# Patient Record
Sex: Male | Born: 1940 | Race: Black or African American | Hispanic: No | Marital: Married
Health system: Southern US, Community
[De-identification: ages and names within clinical notes are randomized; demographics above are authoritative.]

## PROBLEM LIST (undated history)

## (undated) DIAGNOSIS — I10 Essential (primary) hypertension: Secondary | ICD-10-CM

---

## 2019-04-24 DIAGNOSIS — H524 Presbyopia: Secondary | ICD-10-CM | POA: Diagnosis not present

## 2019-05-29 DIAGNOSIS — N529 Male erectile dysfunction, unspecified: Secondary | ICD-10-CM | POA: Diagnosis not present

## 2019-05-29 DIAGNOSIS — I10 Essential (primary) hypertension: Secondary | ICD-10-CM | POA: Diagnosis not present

## 2019-05-29 DIAGNOSIS — E7849 Other hyperlipidemia: Secondary | ICD-10-CM | POA: Diagnosis not present

## 2019-05-29 DIAGNOSIS — R229 Localized swelling, mass and lump, unspecified: Secondary | ICD-10-CM | POA: Diagnosis not present

## 2019-08-01 ENCOUNTER — Emergency Department
Admission: EM | Admit: 2019-08-01 | Discharge: 2019-08-01 | Disposition: A | Discharge status: Home / Self Care | Payer: Medicare Other | Attending: Emergency Medicine | Admitting: Emergency Medicine

## 2019-08-01 ENCOUNTER — Emergency Department: Payer: Medicare Other

## 2019-08-01 ENCOUNTER — Encounter: Payer: Self-pay | Admitting: Emergency Medicine

## 2019-08-01 ENCOUNTER — Other Ambulatory Visit: Payer: Self-pay

## 2019-08-01 DIAGNOSIS — Y998 Other external cause status: Secondary | ICD-10-CM | POA: Insufficient documentation

## 2019-08-01 DIAGNOSIS — Z23 Encounter for immunization: Secondary | ICD-10-CM | POA: Insufficient documentation

## 2019-08-01 DIAGNOSIS — Y9389 Activity, other specified: Secondary | ICD-10-CM | POA: Diagnosis not present

## 2019-08-01 DIAGNOSIS — S60942A Unspecified superficial injury of right middle finger, initial encounter: Secondary | ICD-10-CM | POA: Diagnosis present

## 2019-08-01 DIAGNOSIS — S61312A Laceration without foreign body of right middle finger with damage to nail, initial encounter: Secondary | ICD-10-CM | POA: Insufficient documentation

## 2019-08-01 DIAGNOSIS — I10 Essential (primary) hypertension: Secondary | ICD-10-CM | POA: Diagnosis not present

## 2019-08-01 DIAGNOSIS — Y92812 Truck as the place of occurrence of the external cause: Secondary | ICD-10-CM | POA: Insufficient documentation

## 2019-08-01 DIAGNOSIS — Z79899 Other long term (current) drug therapy: Secondary | ICD-10-CM | POA: Insufficient documentation

## 2019-08-01 DIAGNOSIS — W231XXA Caught, crushed, jammed, or pinched between stationary objects, initial encounter: Secondary | ICD-10-CM | POA: Insufficient documentation

## 2019-08-01 DIAGNOSIS — S62662A Nondisplaced fracture of distal phalanx of right middle finger, initial encounter for closed fracture: Secondary | ICD-10-CM | POA: Diagnosis not present

## 2019-08-01 HISTORY — DX: Essential (primary) hypertension: I10

## 2019-08-01 MED ORDER — HYDROCODONE-ACETAMINOPHEN 5-325 MG PO TABS: 1.0000 | ORAL_TABLET | Freq: Four times a day (QID) | ORAL | 0 refills | Status: AC | PRN

## 2019-08-01 MED ORDER — LIDOCAINE HCL (PF) 1 % IJ SOLN
5.0000 mL | Freq: Once | INTRAMUSCULAR | Status: AC
  Administered 2019-08-01: 5 mL via INTRADERMAL
  Filled 2019-08-01: qty 5

## 2019-08-01 MED ORDER — TRAMADOL HCL 50 MG PO TABS
50.0000 mg | ORAL_TABLET | Freq: Once | ORAL | Status: AC
  Administered 2019-08-01: 50 mg via ORAL
  Filled 2019-08-01: qty 1

## 2019-08-01 MED ORDER — BUPIVACAINE HCL (PF) 0.5 % IJ SOLN
10.0000 mL | Freq: Once | INTRAMUSCULAR | Status: AC
  Administered 2019-08-01: 10 mL
  Filled 2019-08-01: qty 10

## 2019-08-01 MED ORDER — CEPHALEXIN 500 MG PO CAPS
500.0000 mg | ORAL_CAPSULE | Freq: Two times a day (BID) | ORAL | 0 refills | Status: AC
Start: 2019-08-01 — End: 2019-08-08

## 2019-08-01 MED ORDER — TETANUS-DIPHTH-ACELL PERTUSSIS 5-2.5-18.5 LF-MCG/0.5 IM SUSP
0.5000 mL | Freq: Once | INTRAMUSCULAR | Status: AC
  Administered 2019-08-01: 0.5 mL via INTRAMUSCULAR
  Filled 2019-08-01: qty 0.5

## 2019-08-01 NOTE — Discharge Instructions (Signed)
Do not get the sutured area wet for 24 hours. After 24 hours, shower/bathe as usual and pat the area dry. Change the bandage 2 times per day and apply antibiotic ointment. Leave open to air when at no risk of getting the area dirty, but cover at night before bed.

## 2019-08-01 NOTE — ED Triage Notes (Signed)
Pt reports was loading a golf cart onto a truck and smashed his right hand middle finger. Bleeding controlled at this time

## 2019-08-01 NOTE — ED Notes (Addendum)
See triage note  Presents with injury to right middle finger  States he was loading  something onto a truck and shut the tailgate  Hit his finger  smashed his finger

## 2019-08-01 NOTE — ED Provider Notes (Signed)
[facility] Emergency Department Provider Note  ____________________________________________  Time seen: Approximately 10:50 AM  I have reviewed the triage vital signs and the nursing notes.   HISTORY  Chief Complaint Finger Injury and Laceration   HPI Manuel Goodwin is a 79 y.o. male presents to the emergency department for treatment and evaluation after smashing his right middle finger between a dog house and the tailgate.  He states this happened while he was loading the dog house in the back of the truck.  He has a laceration through the nail of the right middle finger.  Tdap is not current.  Bleeding is well controlled.  No alleviating measures attempted prior to arrival.   Past Medical History:  Diagnosis Date   Hypertension     There are no problems to display for this patient.   History reviewed. No pertinent surgical history.  Prior to Admission medications   Medication Sig Start Date End Date Taking? Authorizing Provider  atorvastatin (LIPITOR) 80 MG tablet Take 80 mg by mouth daily.   Yes [provider]  clopidogrel (PLAVIX) 75 MG tablet Take 75 mg by mouth daily.   Yes [provider]  hydrochlorothiazide (HYDRODIURIL) 25 MG tablet Take 25 mg by mouth daily.   Yes [provider]  lisinopril (ZESTRIL) 20 MG tablet Take 20 mg by mouth daily.   Yes [provider]  cephALEXin (KEFLEX) 500 MG capsule Take 1 capsule (500 mg total) by mouth 2 (two) times daily for 7 days. 08/01/19 08/08/19  Aiyah Scarpelli, Johnette Abraham B, FNP  HYDROcodone-acetaminophen (NORCO/VICODIN) 5-325 MG tablet Take 1 tablet by mouth every 6 (six) hours as needed for up to 3 days for severe pain. 08/01/19 08/04/19  Sherrie George B, FNP    Allergies Codeine  No family history on file.  Social History Social History   Tobacco Use   Smoking status: Not on file  Substance Use Topics   Alcohol use: Not on file   Drug use: Not on file    Review  of Systems  Constitutional: Negative for fever. Respiratory: Negative for cough or shortness of breath.  Musculoskeletal: Negative for myalgias Skin: Positive for laceration to the right middle finger. Neurological: Negative for numbness or paresthesias. ____________________________________________   PHYSICAL EXAM:  VITAL SIGNS: ED Triage Vitals  Enc Vitals Group     BP 08/01/19 1008 (!) 168/87     Pulse Rate 08/01/19 1008 67     Resp 08/01/19 1008 19     Temp 08/01/19 1008 98.4 F (36.9 C)     Temp Source 08/01/19 1008 Oral     SpO2 08/01/19 1008 100 %     Weight 08/01/19 1001 160 lb (72.6 kg)     Height 08/01/19 1001 5\' 6"  (1.676 m)     Head Circumference --      Peak Flow --      Pain Score 08/01/19 1001 10     Pain Loc --      Pain Edu? --      Excl. in [place]? --      Constitutional: Well appearing. Eyes: Conjunctivae are clear without discharge or drainage. Nose: No rhinorrhea noted. Mouth/Throat: Airway is patent.  Neck: No stridor. Unrestricted range of motion observed. Cardiovascular: Capillary refill is <3 seconds.  Respiratory: Respirations are even and unlabored.. Musculoskeletal: Unrestricted range of motion observed. Neurologic: Awake, alert, and oriented x 4.  Skin: Horizontal laceration through the upper third of the nail of the right middle finger.  ____________________________________________   LABS (all labs ordered are listed, but only abnormal results are displayed)  Labs Reviewed - No data to display ____________________________________________  EKG  Not indicated. ____________________________________________  RADIOLOGY  Nondisplaced tuft fracture of the right middle finger. ____________________________________________   PROCEDURES  .Marland KitchenLaceration Repair  Date/Time: 08/01/2019 3:22 PM Performed by: Victorino Dike, FNP Authorized by: Victorino Dike, FNP   Consent:    Consent obtained:  Verbal   Consent given by:  Patient    Risks discussed:  Infection, pain and poor cosmetic result Anesthesia (see MAR for exact dosages):    Anesthesia method:  Local infiltration   Local anesthetic:  Lidocaine 1% w/o epi and bupivacaine 0.5% w/o epi Laceration details:    Location:  Finger   Finger location:  R long finger Repair type:    Repair type:  Intermediate Pre-procedure details:    Preparation:  Patient was prepped and draped in usual sterile fashion Treatment:    Area cleansed with:  Betadine and saline   Amount of cleaning:  Standard   Irrigation method:  Tap   Visualized foreign bodies/material removed: no   Skin repair:    Repair method:  Sutures   Suture size:  4-0   Suture material:  Fast-absorbing gut   Suture technique:  Simple interrupted   Number of sutures:  3 Approximation:    Approximation:  Close Post-procedure details:    Dressing:  Splint for protection   Patient tolerance of procedure:  Tolerated well, no immediate complications   ____________________________________________   INITIAL IMPRESSION / ASSESSMENT AND PLAN / ED COURSE  Manuel Goodwin is a 79 y.o. male presenting to the emergency department for treatment and evaluation after accidentally smashing his finger.  See HPI for further details.  Plan will be to update his tetanus booster and get an x-ray of the finger.  X-ray shows a nondisplaced tuft fracture.  Plan will be to perform digital block and further assess the laceration through the nailbed.  Laceration through closed.  See procedure note for details.  Patient tolerated the procedure well.  He will follow up with orthopedics.  He will be placed on Keflex and given Norco for pain.  He was encouraged to see primary care or return to the emergency department for sign or concern of infection if he is unable to see orthopedics.     Medications  traMADol (ULTRAM) tablet 50 mg (50 mg Oral Given 08/01/19 1108)  Tdap (BOOSTRIX) injection 0.5 mL (0.5 mLs Intramuscular Given 08/01/19  1109)  lidocaine (PF) (XYLOCAINE) 1 % injection 5 mL (5 mLs Intradermal Given 08/01/19 1109)  bupivacaine (MARCAINE) 0.5 % injection 10 mL (10 mLs Infiltration Given 08/01/19 1109)     Pertinent labs & imaging results that were available during my care of the patient were reviewed by me and considered in my medical decision making (see chart for details).  ____________________________________________   FINAL CLINICAL IMPRESSION(S) / ED DIAGNOSES  Final diagnoses:  Laceration of right middle finger without foreign body with damage to nail, initial encounter    ED Discharge Orders         Ordered    cephALEXin (KEFLEX) 500 MG capsule  2 times daily     Discontinue  Reprint     08/01/19 1248    HYDROcodone-acetaminophen (NORCO/VICODIN) 5-325 MG tablet  Every 6 hours PRN     Discontinue  Reprint     08/01/19 1248  Note:  This document was prepared using Dragon voice recognition software and may include unintentional dictation errors.   Victorino Dike, FNP 08/01/19 1527    Earleen Newport, MD 08/02/19 [phone]

## 2019-10-08 DIAGNOSIS — M545 Low back pain: Secondary | ICD-10-CM | POA: Diagnosis not present

## 2019-10-08 DIAGNOSIS — R229 Localized swelling, mass and lump, unspecified: Secondary | ICD-10-CM | POA: Diagnosis not present

## 2019-10-08 DIAGNOSIS — C61 Malignant neoplasm of prostate: Secondary | ICD-10-CM | POA: Diagnosis not present

## 2019-10-24 ENCOUNTER — Other Ambulatory Visit: Payer: Self-pay | Admitting: Otolaryngology

## 2019-10-24 DIAGNOSIS — R221 Localized swelling, mass and lump, neck: Secondary | ICD-10-CM

## 2019-10-24 DIAGNOSIS — D37030 Neoplasm of uncertain behavior of the parotid salivary glands: Secondary | ICD-10-CM | POA: Diagnosis not present

## 2019-11-17 ENCOUNTER — Ambulatory Visit
Admission: RE | Admit: 2019-11-17 | Discharge: 2019-11-17 | Disposition: A | Discharge status: Home / Self Care | Payer: Medicare Other | Source: Ambulatory Visit | Attending: Otolaryngology | Admitting: Otolaryngology

## 2019-11-17 ENCOUNTER — Other Ambulatory Visit: Payer: Self-pay

## 2019-11-17 DIAGNOSIS — R221 Localized swelling, mass and lump, neck: Secondary | ICD-10-CM

## 2019-11-17 MED ORDER — IOPAMIDOL (ISOVUE-370) INJECTION 76%
75.0000 mL | Freq: Once | INTRAVENOUS | Status: AC | PRN
  Administered 2019-11-17: 75 mL via INTRAVENOUS

## 2020-02-09 ENCOUNTER — Other Ambulatory Visit (HOSPITAL_COMMUNITY): Payer: Self-pay | Admitting: Otolaryngology

## 2020-02-09 DIAGNOSIS — K118 Other diseases of salivary glands: Secondary | ICD-10-CM

## 2020-02-10 NOTE — Progress Notes (Signed)
Manuel Goodwin Male, 80 y.o., 26-Jan-1940  MRN:  [id number] Phone:  [phone] (M)       PCP:  Patient, No Pcp Per Coverage:  [employer] Medicare/[employer] Medicare            RE: Biopsy Received: Today  Message Details  Greggory Keen, MD  Manuel Goodwin for [place] bx left parotid mass   See recent neck CT   TS    Previous Messages  ----- Message -----  From: Lenore Cordia  Sent: 02/09/2020  2:47 PM EST  To: Ir Procedure Requests  Subject: Biopsy                      Procedure Requested: FNA    Reason for Procedure: parotid mass    Provider Requesting: Dr Benjamine Mola  Provider Telephone: [phone]    Other Info: Parotid Mass seen on neck CT

## 2020-03-24 ENCOUNTER — Other Ambulatory Visit (HOSPITAL_COMMUNITY): Payer: Self-pay | Admitting: Internal Medicine

## 2020-03-24 ENCOUNTER — Ambulatory Visit (HOSPITAL_COMMUNITY)
Admission: RE | Admit: 2020-03-24 | Discharge: 2020-03-24 | Disposition: A | Discharge status: Home / Self Care | Payer: Medicare (Managed Care) | Source: Ambulatory Visit | Attending: Internal Medicine | Admitting: Internal Medicine

## 2020-03-24 ENCOUNTER — Other Ambulatory Visit: Payer: Self-pay | Admitting: Internal Medicine

## 2020-03-24 ENCOUNTER — Other Ambulatory Visit: Payer: Self-pay | Admitting: Otolaryngology

## 2020-03-24 ENCOUNTER — Other Ambulatory Visit: Payer: Self-pay

## 2020-03-24 DIAGNOSIS — Z8673 Personal history of transient ischemic attack (TIA), and cerebral infarction without residual deficits: Secondary | ICD-10-CM | POA: Insufficient documentation

## 2020-03-24 DIAGNOSIS — K118 Other diseases of salivary glands: Secondary | ICD-10-CM

## 2020-03-24 DIAGNOSIS — Z8639 Personal history of other endocrine, nutritional and metabolic disease: Secondary | ICD-10-CM

## 2020-03-24 DIAGNOSIS — Z8669 Personal history of other diseases of the nervous system and sense organs: Secondary | ICD-10-CM

## 2020-03-29 ENCOUNTER — Other Ambulatory Visit: Payer: Self-pay | Admitting: Student

## 2020-03-30 ENCOUNTER — Other Ambulatory Visit: Payer: Self-pay

## 2020-03-30 ENCOUNTER — Ambulatory Visit
Admission: RE | Admit: 2020-03-30 | Discharge: 2020-03-30 | Disposition: A | Discharge status: Home / Self Care | Payer: Medicare (Managed Care) | Source: Ambulatory Visit | Attending: Otolaryngology | Admitting: Otolaryngology

## 2020-03-30 DIAGNOSIS — K118 Other diseases of salivary glands: Secondary | ICD-10-CM | POA: Diagnosis not present

## 2020-03-30 NOTE — Procedures (Signed)
  Procedure: US guided core biopsy L parotid nodule   EBL:   minimal Complications:  none immediate  See full dictation in [employer].  Dillard Cannon MD Main # [phone] Pager  [phone] Mobile [phone]

## 2020-03-30 NOTE — H&P (Signed)
Chief Complaint: Patient was seen in consultation today for parotid mass biopsy  Referring Physician(s): Benjamine Mola, Su  Supervising Physician: Arne Cleveland  Patient Status: [facility] - Out-pt  History of Present Illness: Manuel Goodwin is a 80 y.o. male with a medical history significant for HTN. He has a mass under his left ear that seems to be enlarging; imaging obtained.   CT soft tissue neck 11/17/19 Salivary glands: Submandibular glands are normal. Right parotid gland is normal. Within the deep lobe of the left parotid, extending to the deep/superficial junction, there is a somewhat subtle mass measuring 2.2 x 1.2 x 1.2 cm. This does not appear distinctly marginated. Therefore, this is viewed with some concern. This could represent a benign or malignant lesion.  Interventional Radiology has been asked to evaluate this patient for an image-guided left parotid mass biopsy for further work up. This case has been reviewed and procedure approved by Dr. Annamaria Boots.   Past Medical History:  Diagnosis Date  . Hypertension     No past surgical history on file.  Allergies: Codeine  Medications: Prior to Admission medications   Medication Sig Start Date End Date Taking? Authorizing Provider  atorvastatin (LIPITOR) 80 MG tablet Take 80 mg by mouth daily.    [provider]  clopidogrel (PLAVIX) 75 MG tablet Take 75 mg by mouth daily.    [provider]  hydrochlorothiazide (HYDRODIURIL) 25 MG tablet Take 25 mg by mouth daily.    [provider]  lisinopril (ZESTRIL) 20 MG tablet Take 20 mg by mouth daily.    [provider]     No family history on file.  Social History   Socioeconomic History  . Marital status: Married    Spouse name: Not on file  . Number of children: Not on file  . Years of education: Not on file  . Highest education level: Not on file  Occupational History  . Not on file  Tobacco Use  . Smoking status: Not on file  .  Smokeless tobacco: Not on file  Substance and Sexual Activity  . Alcohol use: Not on file  . Drug use: Not on file  . Sexual activity: Not on file  Other Topics Concern  . Not on file  Social History Narrative  . Not on file   Social Determinants of Health   Financial Resource Strain: Not on file  Food Insecurity: Not on file  Transportation Needs: Not on file  Physical Activity: Not on file  Stress: Not on file  Social Connections: Not on file    Review of Systems: A 12 point ROS discussed and pertinent positives are indicated in the HPI above.  All other systems are negative.  Imaging: VAS US CAROTID  Result Date: 03/24/2020 Carotid Arterial Duplex Study Indications:  Carotid artery disease. Patient reports no symptoms. 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 Performing Technologist: Ronal Fear RVS, RCS  Examination Guidelines: A complete evaluation includes B-mode imaging, spectral Doppler, color Doppler, and power Doppler as needed of all accessible portions of each vessel. Bilateral testing is considered an integral part of a complete examination. Limited examinations for reoccurring indications may be performed as noted.  Right Carotid Findings: +----------+--------+--------+--------+------------------+--------+           PSV cm/sEDV cm/sStenosisPlaque DescriptionComments +----------+--------+--------+--------+------------------+--------+ CCA Prox  106     19                                         +----------+--------+--------+--------+------------------+--------+  CCA Mid   96      17              heterogenous               +----------+--------+--------+--------+------------------+--------+ CCA Distal70      17              heterogenous               +----------+--------+--------+--------+------------------+--------+ ICA Prox  45      16      Normal                             +----------+--------+--------+--------+------------------+--------+  ICA Mid   61      19                                         +----------+--------+--------+--------+------------------+--------+ ICA Distal76      24                                         +----------+--------+--------+--------+------------------+--------+ ECA       58      10                                         +----------+--------+--------+--------+------------------+--------+ +----------+--------+-------+----------------+-------------------+           PSV cm/sEDV cmsDescribe        Arm Pressure (mmHG) +----------+--------+-------+----------------+-------------------+ [id number]            Multiphasic, WNL                    +----------+--------+-------+----------------+-------------------+ +---------+--------+--+--------+--+---------+ VertebralPSV cm/s61EDV cm/s15Antegrade +---------+--------+--+--------+--+---------+  Left Carotid Findings: +----------+--------+--------+--------+------------------+--------+           PSV cm/sEDV cm/sStenosisPlaque DescriptionComments +----------+--------+--------+--------+------------------+--------+ CCA Prox  143     32                                         +----------+--------+--------+--------+------------------+--------+ CCA Mid   99      27              heterogenous               +----------+--------+--------+--------+------------------+--------+ CCA Distal80      18              heterogenous               +----------+--------+--------+--------+------------------+--------+ ICA Prox  93      30      1-39%   heterogenous               +----------+--------+--------+--------+------------------+--------+ ICA Mid   106     28                                         +----------+--------+--------+--------+------------------+--------+ ICA Distal82      29                                         +----------+--------+--------+--------+------------------+--------+  ECA       80      17                                          +----------+--------+--------+--------+------------------+--------+ +----------+--------+--------+----------------+-------------------+           PSV cm/sEDV cm/sDescribe        Arm Pressure (mmHG) +----------+--------+--------+----------------+-------------------+ [id number]             Multiphasic, WNL                    +----------+--------+--------+----------------+-------------------+ +---------+--------+--+--------+--+---------+ VertebralPSV cm/s51EDV cm/s14Antegrade +---------+--------+--+--------+--+---------+   Summary: Right Carotid: There is no evidence of stenosis in the right ICA. Left Carotid: Velocities in the left ICA are consistent with a 1-39% stenosis. Vertebrals:  Bilateral vertebral arteries demonstrate antegrade flow. Subclavians: Normal flow hemodynamics were seen in bilateral subclavian              arteries. *See table(s) above for measurements and observations.  Electronically signed by Deitra Mayo MD on 03/24/2020 at 10:24:11 AM.    Final     Labs:  CBC: No results for input(s): WBC, HGB, HCT, PLT in the last 8760 hours.  COAGS: No results for input(s): INR, APTT in the last 8760 hours.  BMP: No results for input(s): NA, K, CL, CO2, GLUCOSE, BUN, CALCIUM, CREATININE, GFRNONAA, GFRAA in the last 8760 hours.  Invalid input(s): CMP  LIVER FUNCTION TESTS: No results for input(s): BILITOT, AST, ALT, ALKPHOS, PROT, ALBUMIN in the last 8760 hours.  TUMOR MARKERS: No results for input(s): AFPTM, CEA, CA199, CHROMGRNA in the last 8760 hours.  Assessment and Plan:  Left Parotid Mass: Manuel Goodwin, 80 year old male, presents today to the [facility] Interventional Radiology department for an image-guided left parotid mass biopsy. This patient has requested local anesthesia only. He states he has had this procedure done before.   Risks and benefits of this procedure were discussed  with the patient and/or patient's family including, but not limited to bleeding, infection, damage to adjacent structures or low yield requiring additional tests.  All of the questions were answered and there is agreement to proceed.  Consent signed and in chart.  Thank you for this interesting consult.  I greatly enjoyed meeting Manuel Goodwin and look forward to participating in their care.  A copy of this report was sent to the requesting provider on this date.  Electronically Signed: Soyla Dryer, AGACNP-BC [phone] 03/30/2020, 9:44 AM   I spent a total of  30 Minutes   in face to face in clinical consultation, greater than 50% of which was counseling/coordinating care for left parotid mass biopsy

## 2020-03-31 LAB — SURGICAL PATHOLOGY

## 2021-04-20 ENCOUNTER — Ambulatory Visit: Payer: Medicare (Managed Care) | Admitting: Surgery

## 2021-11-10 ENCOUNTER — Other Ambulatory Visit: Payer: Self-pay | Admitting: Otolaryngology

## 2021-11-10 ENCOUNTER — Other Ambulatory Visit (HOSPITAL_COMMUNITY): Payer: Self-pay | Admitting: Otolaryngology

## 2021-11-10 DIAGNOSIS — T17908A Unspecified foreign body in respiratory tract, part unspecified causing other injury, initial encounter: Secondary | ICD-10-CM

## 2021-11-10 DIAGNOSIS — D110 Benign neoplasm of parotid gland: Secondary | ICD-10-CM

## 2021-11-10 DIAGNOSIS — R1312 Dysphagia, oropharyngeal phase: Secondary | ICD-10-CM

## 2021-11-11 ENCOUNTER — Encounter: Payer: Self-pay | Admitting: General Practice

## 2021-11-11 NOTE — Progress Notes (Signed)
Arne Cleveland, MD  Allen Kell, NT Ok  [place] core L parotid lesion (also previously done in 2022)  [facility]       Previous Messages    ----- Message ----- From: Allen Kell, NT Sent: 11/11/2021   9:13 AM EDT To: Ir Procedure Requests Subject: [place] Core Biopsy                                Procedure: [place] CORE BIOPSY (SALIVARY GLAND/PAROTID GLAND)  Reason: Pleomorphic adenoma of parotid gland  History: CT done in 2021  Provider: Jenetta Downer, MD  Contact: [phone]

## 2021-11-15 ENCOUNTER — Other Ambulatory Visit: Payer: Self-pay | Admitting: Otolaryngology

## 2021-11-15 DIAGNOSIS — D110 Benign neoplasm of parotid gland: Secondary | ICD-10-CM

## 2021-11-22 ENCOUNTER — Other Ambulatory Visit: Payer: Self-pay | Admitting: Radiology

## 2021-11-22 DIAGNOSIS — K118 Other diseases of salivary glands: Secondary | ICD-10-CM

## 2021-11-23 ENCOUNTER — Ambulatory Visit (HOSPITAL_COMMUNITY): Payer: Medicare (Managed Care)

## 2021-11-23 ENCOUNTER — Encounter (HOSPITAL_COMMUNITY): Payer: Self-pay

## 2021-12-19 ENCOUNTER — Other Ambulatory Visit: Payer: Self-pay | Admitting: Radiology

## 2021-12-20 ENCOUNTER — Ambulatory Visit (HOSPITAL_COMMUNITY)
Admission: RE | Admit: 2021-12-20 | Discharge: 2021-12-20 | Disposition: A | Discharge status: Home / Self Care | Payer: Medicare (Managed Care) | Source: Ambulatory Visit | Attending: Otolaryngology | Admitting: Otolaryngology

## 2021-12-20 ENCOUNTER — Other Ambulatory Visit: Payer: Self-pay

## 2021-12-20 ENCOUNTER — Encounter (HOSPITAL_COMMUNITY): Payer: Self-pay

## 2021-12-20 DIAGNOSIS — Z7902 Long term (current) use of antithrombotics/antiplatelets: Secondary | ICD-10-CM | POA: Diagnosis not present

## 2021-12-20 DIAGNOSIS — D110 Benign neoplasm of parotid gland: Secondary | ICD-10-CM | POA: Diagnosis not present

## 2021-12-20 DIAGNOSIS — Z8673 Personal history of transient ischemic attack (TIA), and cerebral infarction without residual deficits: Secondary | ICD-10-CM | POA: Insufficient documentation

## 2021-12-20 DIAGNOSIS — K118 Other diseases of salivary glands: Secondary | ICD-10-CM

## 2021-12-20 LAB — CBC WITH DIFFERENTIAL/PLATELET
Abs Immature Granulocytes: 0 10*3/uL (ref 0.00–0.07)
Basophils Absolute: 0 10*3/uL (ref 0.0–0.1)
Basophils Relative: 0 %
Eosinophils Absolute: 0.2 10*3/uL (ref 0.0–0.5)
Eosinophils Relative: 6 %
HCT: 37.4 % — ABNORMAL LOW (ref 39.0–52.0)
Hemoglobin: 11.7 g/dL — ABNORMAL LOW (ref 13.0–17.0)
Immature Granulocytes: 0 %
Lymphocytes Relative: 42 %
Lymphs Abs: 1.5 10*3/uL (ref 0.7–4.0)
MCH: 27 pg (ref 26.0–34.0)
MCHC: 31.3 g/dL (ref 30.0–36.0)
MCV: 86.2 fL (ref 80.0–100.0)
Monocytes Absolute: 0.4 10*3/uL (ref 0.1–1.0)
Monocytes Relative: 10 %
Neutro Abs: 1.5 10*3/uL — ABNORMAL LOW (ref 1.7–7.7)
Neutrophils Relative %: 42 %
Platelets: 168 10*3/uL (ref 150–400)
RBC: 4.34 MIL/uL (ref 4.22–5.81)
RDW: 15.8 % — ABNORMAL HIGH (ref 11.5–15.5)
WBC: 3.6 10*3/uL — ABNORMAL LOW (ref 4.0–10.5)
nRBC: 0 % (ref 0.0–0.2)

## 2021-12-20 LAB — PROTIME-INR
INR: 1 (ref 0.8–1.2)
Prothrombin Time: 13.3 seconds (ref 11.4–15.2)

## 2021-12-20 LAB — BASIC METABOLIC PANEL
Anion gap: 5 (ref 5–15)
BUN: 12 mg/dL (ref 8–23)
CO2: 27 mmol/L (ref 22–32)
Calcium: 9 mg/dL (ref 8.9–10.3)
Chloride: 106 mmol/L (ref 98–111)
Creatinine, Ser: 1.07 mg/dL (ref 0.61–1.24)
GFR, Estimated: >60 mL/min (ref >60)
Glucose, Bld: 117 mg/dL — ABNORMAL HIGH (ref 70–99)
Potassium: 3.7 mmol/L (ref 3.5–5.1)
Sodium: 138 mmol/L (ref 135–145)

## 2021-12-20 MED ORDER — FENTANYL CITRATE (PF) 100 MCG/2ML IJ SOLN
INTRAMUSCULAR | Status: DC | PRN
  Administered 2021-12-20 (×2): 50 ug via INTRAVENOUS

## 2021-12-20 MED ORDER — FENTANYL CITRATE (PF) 100 MCG/2ML IJ SOLN
INTRAMUSCULAR | Status: AC
  Filled 2021-12-20: qty 2

## 2021-12-20 MED ORDER — SODIUM CHLORIDE 0.9 % IV SOLN: INTRAVENOUS | Status: DC

## 2021-12-20 MED ORDER — LIDOCAINE HCL 1 % IJ SOLN
INTRAMUSCULAR | Status: AC
  Administered 2021-12-20: 10 mL
  Filled 2021-12-20: qty 20

## 2021-12-20 NOTE — Sedation Documentation (Signed)
Pt ate breakfast. Pain meds only.

## 2021-12-20 NOTE — H&P (Signed)
    Referring Physician(s): [place]  Supervising Physician: Mir, Sharen Heck  Patient Status:  [facility]  Chief Complaint:  Left parotid mass  Subjective: Patient familiar to IR service from left parotid mass biopsy on 03/30/2020.  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  Patient now has more discomfort and increase in size of left parotid mass.  He now presents for repeat left parotid mass biopsy to rule out possible malignant transformation prior to possible left parotidectomy.Additional med hx as below. He denies fever,HA,CP,dyspnea, cough, abd /back pain,N/V or bleeding. 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   Past Medical History:  Diagnosis Date   Hypertension    No past surgical history on file.   Allergies: Codeine  Medications: Prior to Admission medications   Medication Sig Start Date End Date Taking? Authorizing Provider  atorvastatin (LIPITOR) 80 MG tablet Take 80 mg by mouth daily.    [provider]  clopidogrel (PLAVIX) 75 MG tablet Take 75 mg by mouth daily.    [provider]  hydrochlorothiazide (HYDRODIURIL) 25 MG tablet Take 25 mg by mouth daily.    [provider]  lisinopril (ZESTRIL) 20 MG tablet Take 20 mg by mouth daily.    [provider]     Vital Signs: Vitals:   12/20/21 1224  BP: 111/64  Pulse: 61  Resp: 18  Temp: 98.3 F (36.8 C)  SpO2: 95%       Physical Exam awake, alert.  Chest clear to auscultation bilaterally.  Heart with regular rate and rhythm.  Abdomen soft, positive bowel sounds, nontender, pretibial edema noted bilaterally.  Palpable nodule left parotid region, slightly tender to palpation  Imaging: No results found.  Labs:  CBC: No results for input(s): "WBC", "HGB", "HCT", "PLT" in the last 8760 hours.  COAGS: No results for input(s): "INR", "APTT" in the last 8760 hours.  BMP: No results for input(s): "NA", "K", "CL",  "CO2", "GLUCOSE", "BUN", "CALCIUM", "CREATININE", "GFRNONAA", "GFRAA" in the last 8760 hours.  Invalid input(s): "CMP"  LIVER FUNCTION TESTS: No results for input(s): "BILITOT", "AST", "ALT", "ALKPHOS", "PROT", "ALBUMIN" in the last 8760 hours.  Assessment and Plan: Patient familiar to IR service from left parotid mass biopsy on 03/30/2020.  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  Patient now has more discomfort and increase in size of left parotid mass.  He now presents for repeat left parotid mass biopsy to rule out possible malignant transformation prior to possible left parotidectomy.Risks and benefits of procedure was discussed with the patient including, but not limited to bleeding, infection, damage to adjacent structures or low yield requiring additional tests.  All of the questions were answered and there is agreement to proceed.  Consent signed and in chart.  LABS PENDING  Electronically Signed: D. Rowe Robert, PA-C 12/20/2021, 12:19 PM   I spent a total of 20 minutes at the the patient's bedside AND on the patient's hospital floor or unit, greater than 50% of which was counseling/coordinating care for image guided left parotid mass biopsy

## 2021-12-20 NOTE — Procedures (Signed)
Interventional Radiology Procedure Note  Procedure: US guided left parotid mass biopsy  Indication: Left intra parotid mass   Findings: Please refer to procedural dictation for full description.  Complications: None  EBL: < 10 mL  Miachel Roux, MD [phone]

## 2021-12-20 NOTE — Discharge Instructions (Signed)
Please call Interventional Radiology clinic [phone] with any questions or concerns.  You may remove your dressing and shower tomorrow.  Needle Biopsy, Care After These instructions tell you how to care for yourself after your procedure. Your doctor may also give you more specific instructions. Call your doctor if you have any problems or questions. What can I expect after the procedure? After the procedure, it is common to have: Soreness. Bruising. Mild pain. Follow these instructions at home:  Return to your normal activities as told by your doctor. Ask your doctor what activities are safe for you. Take over-the-counter and prescription medicines only as told by your doctor. Wash your hands with soap and water before you change your bandage (dressing). If you cannot use soap and water, use hand sanitizer. Follow instructions from your doctor about: How to take care of your puncture site. When and how to change your bandage. When to remove your bandage. Check your puncture site every day for signs of infection. Watch for: Redness, swelling, or pain. Fluid or blood.  Pus or a bad smell. Warmth. Do not take baths, swim, or use a hot tub until your doctor approves. Ask your doctor if you may take showers. You may only be allowed to take sponge baths. Keep all follow-up visits as told by your doctor. This is important. Contact a doctor if you have: A fever. Redness, swelling, or pain at the puncture site, and it lasts longer than a few days. Fluid, blood, or pus coming from the puncture site. Warmth coming from the puncture site. Get help right away if: You have a lot of bleeding from the puncture site. Summary After the procedure, it is common to have soreness, bruising, or mild pain at the puncture site. Check your puncture site every day for signs of infection, such as redness, swelling, or pain. Get help right away if you have severe bleeding from your puncture site. This  information is not intended to replace advice given to you by your health care provider. Make sure you discuss any questions you have with your health care provider. Document Revised: 01/08/2017 Document Reviewed: 01/08/2017 Elsevier Patient Education  2020 [employer].

## 2021-12-21 LAB — SURGICAL PATHOLOGY

## 2021-12-23 ENCOUNTER — Ambulatory Visit (HOSPITAL_COMMUNITY): Payer: Medicare (Managed Care)

## 2022-03-26 IMAGING — CT CT NECK W/ CM
2 of 3 series · 7 of 14 positions shown, 8 images · IV contrast (iopamidol)
Comparison: None.

CLINICAL DATA: Mass under left ear.  Possibly enlarging.

Creatinine was obtained on site at [HOSPITAL] at [HOSPITAL].
Results: Creatinine 1.2 mg/dL.
EXAM:
CT NECK WITH CONTRAST
TECHNIQUE: Multidetector CT imaging of the neck was performed using the
standard protocol following the bolus administration of intravenous
contrast.
CONTRAST:  75mL S5ITQM-XDK IOPAMIDOL (S5ITQM-XDK) INJECTION 76%

[Series 3: neck · axial · 0.51mm/px · z∈[-297,-165]mm · 3 of 134 slices shown, 4 images]
[im 34/134  soft-tissue]
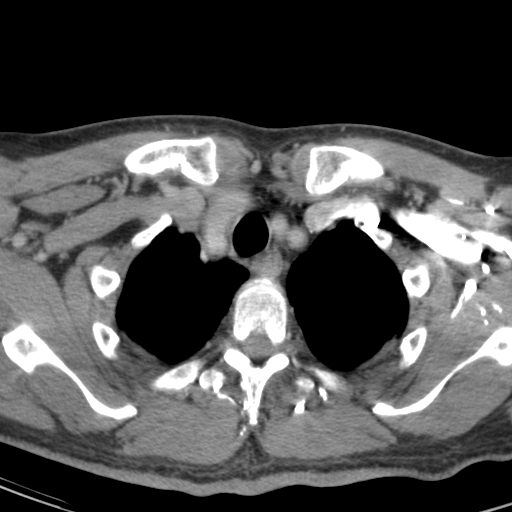
[im 34/134  bone]
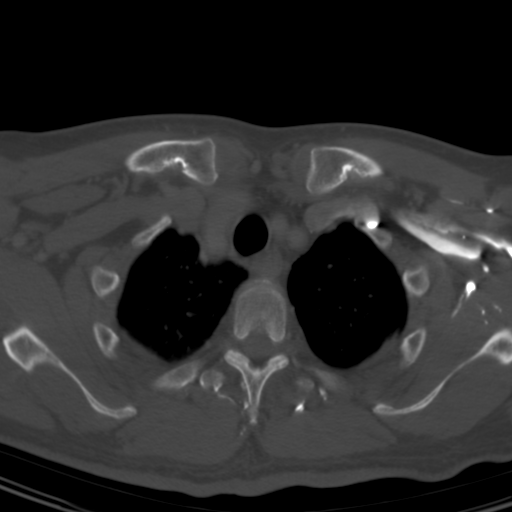
[im 67/134  bone]
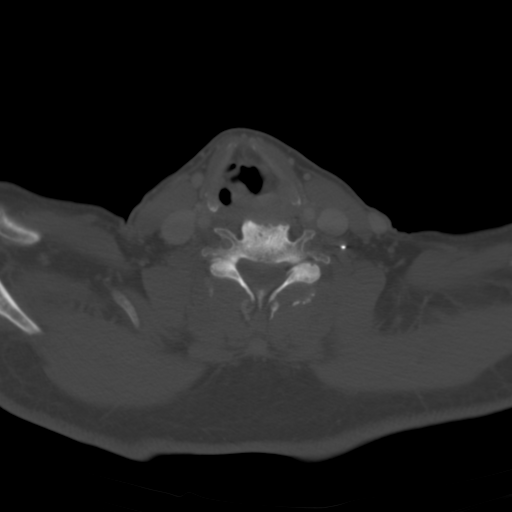
[im 100/134  bone]
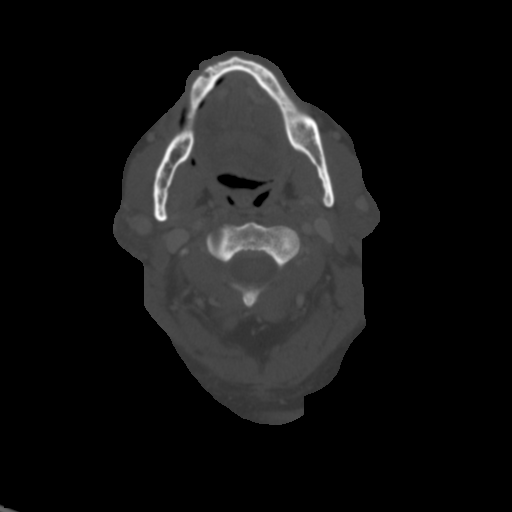

[Series 8: angled axial-oropharynx · axial · 0.48mm/px · z∈[-310,-150]mm · 4 of 134 slices shown]
[im 27/134  bone]
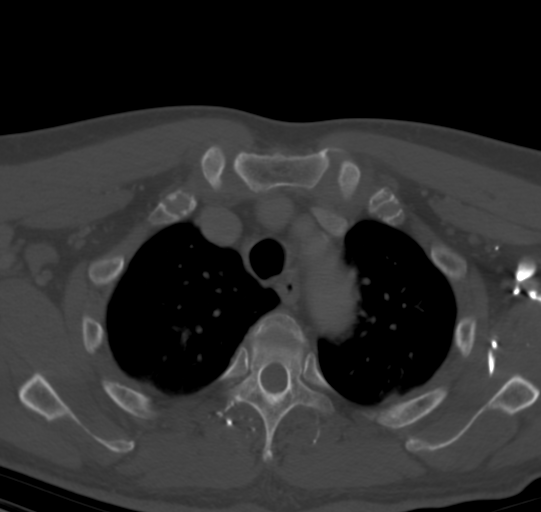
[im 54/134  bone]
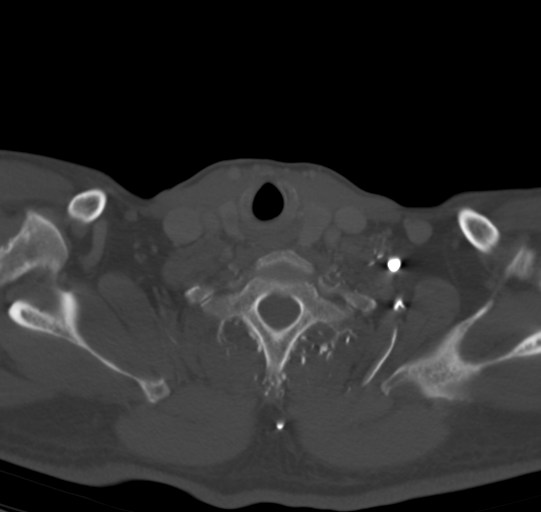
[im 80/134  bone]
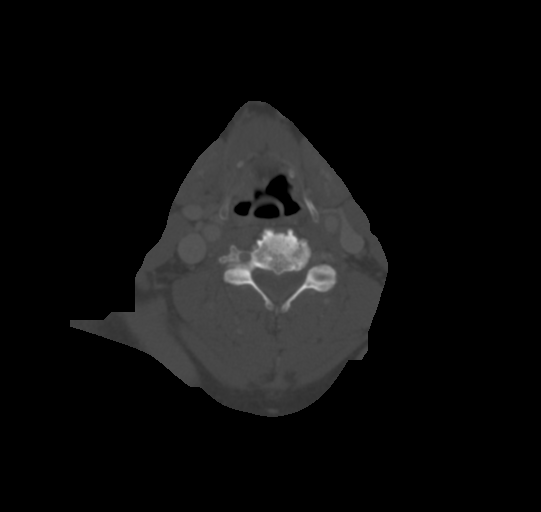
[im 107/134  bone]
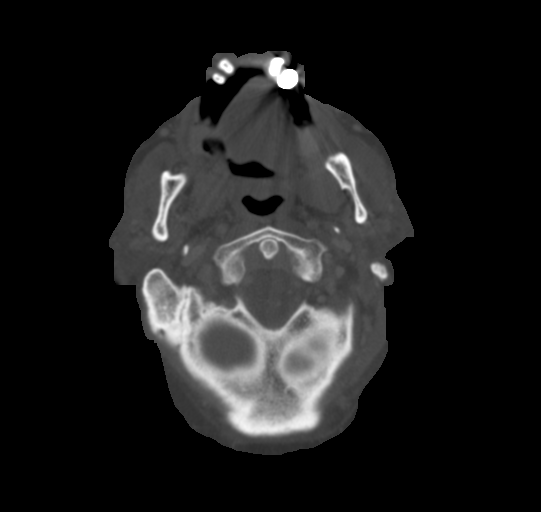

[7 of 14 positions shown; findings below may reference images not displayed]

FINDINGS: Pharynx and larynx: No mucosal or submucosal lesion.

Salivary glands: Submandibular glands are normal. Right parotid
gland is normal. Within the deep lobe of the left parotid, extending
to the deep/superficial junction, there is a somewhat subtle mass
measuring 2.2 x 1.2 x 1.2 cm. This does not appear distinctly
marginated. Therefore, this is viewed with some concern. This could
represent a benign or malignant lesion.

Thyroid: Enlarged and heterogeneous thyroid gland, more prominent on
the left than the right.

Lymph nodes: No enlarged or low-density nodes on either side of the
neck. Normal bilateral cervical chain nodes.

Vascular: Normal

Limited intracranial: Normal

Visualized orbits: Not included

Mastoids and visualized paranasal sinuses: Clear

Skeleton: Ordinary cervical spondylosis.

Upper chest: Aortic atherosclerosis.  Lung apices are clear.

Other: None
IMPRESSION: 1. 2.2 x 1.2 x 1.2 cm mass within the deep lobe of the left parotid
gland extending to the deep/superficial junction. This is viewed
with some concern. This could represent a benign or malignant
lesion.
2. Enlarged and heterogeneous thyroid gland, more prominent on the
left than the right. Recommend thyroid ultrasound (ref: [HOSPITAL]. [DATE]): 143-50).
3. Aortic atherosclerosis.

Aortic Atherosclerosis ([id number]).
